# Patient Record
Sex: Male | Born: 1970 | Race: White | Hispanic: No | Marital: Married | Smoking: Never smoker
Health system: Southern US, Community
[De-identification: ages and names within clinical notes are randomized; demographics above are authoritative.]

## PROBLEM LIST (undated history)

## (undated) DIAGNOSIS — C4491 Basal cell carcinoma of skin, unspecified: Secondary | ICD-10-CM

## (undated) DIAGNOSIS — G4730 Sleep apnea, unspecified: Secondary | ICD-10-CM

## (undated) DIAGNOSIS — K219 Gastro-esophageal reflux disease without esophagitis: Secondary | ICD-10-CM

## (undated) DIAGNOSIS — E119 Type 2 diabetes mellitus without complications: Secondary | ICD-10-CM

## (undated) DIAGNOSIS — K760 Fatty (change of) liver, not elsewhere classified: Secondary | ICD-10-CM

## (undated) HISTORY — DX: Type 2 diabetes mellitus without complications: E11.9

## (undated) HISTORY — DX: Gastro-esophageal reflux disease without esophagitis: K21.9

## (undated) HISTORY — DX: Fatty (change of) liver, not elsewhere classified: K76.0

## (undated) HISTORY — DX: Sleep apnea, unspecified: G47.30

## (undated) HISTORY — DX: Basal cell carcinoma of skin, unspecified: C44.91

## (undated) HISTORY — PX: LUMBAR SPINE SURGERY: SHX701

## (undated) HISTORY — PX: SKIN CANCER EXCISION: SHX779

---

## 2013-03-06 DIAGNOSIS — Z85828 Personal history of other malignant neoplasm of skin: Secondary | ICD-10-CM | POA: Insufficient documentation

## 2018-01-09 ENCOUNTER — Ambulatory Visit: Payer: BLUE CROSS/BLUE SHIELD | Admitting: Podiatry

## 2018-01-09 ENCOUNTER — Encounter: Payer: Self-pay | Admitting: Podiatry

## 2018-01-09 ENCOUNTER — Other Ambulatory Visit: Payer: Self-pay | Admitting: Podiatry

## 2018-01-09 ENCOUNTER — Ambulatory Visit (INDEPENDENT_AMBULATORY_CARE_PROVIDER_SITE_OTHER): Payer: BLUE CROSS/BLUE SHIELD

## 2018-01-09 VITALS — BP 138/81 | HR 81

## 2018-01-09 DIAGNOSIS — M79671 Pain in right foot: Secondary | ICD-10-CM | POA: Diagnosis not present

## 2018-01-09 DIAGNOSIS — M722 Plantar fascial fibromatosis: Secondary | ICD-10-CM

## 2018-01-09 MED ORDER — TRIAMCINOLONE ACETONIDE 10 MG/ML IJ SUSP
10.0000 mg | Freq: Once | INTRAMUSCULAR | Status: AC
  Administered 2018-01-09: 10 mg

## 2018-01-09 MED ORDER — DICLOFENAC SODIUM 75 MG PO TBEC: 75.0000 mg | DELAYED_RELEASE_TABLET | Freq: Two times a day (BID) | ORAL | 2 refills | Status: DC

## 2018-01-09 NOTE — Patient Instructions (Signed)

## 2018-01-09 NOTE — Progress Notes (Signed)
Subjective:   Patient ID: Kristopher Hamilton, male   DOB: 47 y.o.   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  Patient does not smoke and likes to be active   Review of Systems  All other systems reviewed and are negative.       Objective:  Physical Exam  Constitutional: He appears well-developed and well-nourished.  Cardiovascular: Intact distal pulses.  Pulmonary/Chest: Effort normal.  Musculoskeletal: Normal range of motion.  Neurological: He is alert.  Skin: Skin is warm.  Nursing note and vitals reviewed.   Neurovascular status intact muscle strength is found to be adequate with patient found to have exquisite discomfort plantar aspect right heel at the insertional point of the tendon into the calcaneus with inflammation fluid buildup around the medial band.  Patient has mild depression of the arch and was found to have good digital perfusion     Assessment:  Acute plantar fasciitis right with inflammation fluid of the medial band     Plan:  H&P condition reviewed and today I injected the plantar fascia right 3 mg Kenalog 5 mg Xylocaine applied fascial brace placed on diclofenac 75 mg twice daily and instructed on long-term orthotic therapy.  Patient will be seen back to reevaluate again in 2 weeks and was given instructions on stretching exercises  X-rays indicate small spur with no indication of stress fracture or arthritis

## 2018-01-23 ENCOUNTER — Ambulatory Visit: Payer: BLUE CROSS/BLUE SHIELD | Admitting: Podiatry

## 2018-01-23 ENCOUNTER — Encounter: Payer: Self-pay | Admitting: Podiatry

## 2018-01-23 DIAGNOSIS — M722 Plantar fascial fibromatosis: Secondary | ICD-10-CM

## 2018-01-25 NOTE — Progress Notes (Signed)
Subjective:   Patient ID: Kristopher Hamilton, male   DOB: 47 y.o.   MRN: [id number]   HPI Patient presents stating foot feels a lot better with minimal discomfort   ROS      Objective:  Physical Exam  Neurovascular status intact with diminishment of discomfort plantar aspect right with pain still upon deep palpation but overall significantly improved     Assessment:  Plantar fasciitis improved with mild discomfort     Plan:  Reviewed condition recommended continued anti-inflammatory and patient will be seen back for [place] to recheck

## 2018-02-25 ENCOUNTER — Ambulatory Visit: Payer: BLUE CROSS/BLUE SHIELD | Admitting: Orthotics

## 2018-02-25 DIAGNOSIS — M722 Plantar fascial fibromatosis: Secondary | ICD-10-CM

## 2018-02-25 NOTE — Progress Notes (Signed)
Patient came in today to pick up custom made foot orthotics.  The goals were accomplished and the patient reported no dissatisfaction with said orthotics.  Patient was advised of breakin period and how to report any issues. 

## 2018-04-04 ENCOUNTER — Encounter: Payer: Self-pay | Admitting: Gastroenterology

## 2018-04-26 ENCOUNTER — Encounter: Payer: Self-pay | Admitting: Gastroenterology

## 2018-04-26 ENCOUNTER — Other Ambulatory Visit (INDEPENDENT_AMBULATORY_CARE_PROVIDER_SITE_OTHER): Payer: BLUE CROSS/BLUE SHIELD

## 2018-04-26 ENCOUNTER — Ambulatory Visit: Payer: BLUE CROSS/BLUE SHIELD | Admitting: Gastroenterology

## 2018-04-26 VITALS — BP 120/70 | HR 92 | Ht 72.0 in | Wt 177.0 lb

## 2018-04-26 DIAGNOSIS — R945 Abnormal results of liver function studies: Secondary | ICD-10-CM | POA: Diagnosis not present

## 2018-04-26 DIAGNOSIS — R7989 Other specified abnormal findings of blood chemistry: Secondary | ICD-10-CM

## 2018-04-26 LAB — HEPATIC FUNCTION PANEL
ALK PHOS: 112 U/L (ref 39–117)
ALT: 284 U/L — AB (ref 0–53)
AST: 113 U/L — AB (ref 0–37)
Albumin: 4.3 g/dL (ref 3.5–5.2)
BILIRUBIN DIRECT: 0.1 mg/dL (ref 0.0–0.3)
Total Bilirubin: 0.4 mg/dL (ref 0.2–1.2)
Total Protein: 7.1 g/dL (ref 6.0–8.3)

## 2018-04-26 LAB — PROTIME-INR
INR: 1 ratio (ref 0.8–1.0)
PROTHROMBIN TIME: 12.2 s (ref 9.6–13.1)

## 2018-04-26 LAB — IBC PANEL
Iron: 93 ug/dL (ref 42–165)
SATURATION RATIOS: 20.8 % (ref 20.0–50.0)
Transferrin: 320 mg/dL (ref 212.0–360.0)

## 2018-04-26 LAB — IGA: IGA: 296 mg/dL (ref 68–378)

## 2018-04-26 LAB — FERRITIN: FERRITIN: 481.2 ng/mL — AB (ref 22.0–322.0)

## 2018-04-26 NOTE — Progress Notes (Signed)
04/26/2018 Kristopher Hamilton [id number] [birth year]   HISTORY OF PRESENT ILLNESS:  This is a pleasant 47 year old male who is new to our office.  He is here today at the request of his PCP, Dellia Nims, NP, for evaluation of elevated liver enzymes.  Apparently the first that he was aware of this elevation was back in February when he was found to have an AST of 61 and ALT of 121.  They have continued to trend upwards over the past several months now in August with an AST of 251 and ALT of 545.  Alk phos and total bili have remained normal.  Viral hepatitis studies are negative.  He says that he had an ultrasound that showed fatty liver.  We do not have those records but we are trying to obtain those.  He denies any alcohol use at this time.  Says that he had a stint where he drank larger amounts in the past, but not for an extended period of time.  He denies any over-the-counter medication or supplement use.  He denies any family history of liver disease.  While he is here he does mention that he tends to have alternating bowel habits.  Says that sometimes he will only have a bowel movement once a week and other times he will have 3-4 bowel movements a day.  He says that his been like this for as long as he can remember.  Denies rectal bleeding.  Apparently he has seen Dr. Posey Pronto in [place], [place] in the past and had an endoscopy in approximately February 2017 for complaints of reflux.  We will try to obtain his results.  He is on pantoprazole 40 mg daily and admits that that seems to control his reflux pretty well.    Past Medical History:  Diagnosis Date  . Diabetes ([place])   . Skin cancer, basal cell   . Sleep apnea    Past Surgical History:  Procedure Laterality Date  . LUMBAR SPINE SURGERY    . SKIN CANCER EXCISION      reports that he has never smoked. He quit smokeless tobacco use about 2 years ago. His alcohol and drug histories are not on file. family history includes Diabetes in his  father; Hypertension in his mother; Prostate cancer in his father and maternal grandfather. No Known Allergies    Outpatient Encounter Medications as of 04/26/2018  Medication Sig  . aspirin EC 81 MG tablet Take 81 mg by mouth daily.  . diclofenac (VOLTAREN) 75 MG EC tablet Take 1 tablet (75 mg total) by mouth 2 (two) times daily.  Marland Kitchen docusate sodium (COLACE) 100 MG capsule Take 100 mg by mouth 2 (two) times daily.  . fexofenadine (ALLEGRA) 180 MG tablet Take 180 mg by mouth daily.  Marland Kitchen lisinopril (PRINIVIL,ZESTRIL) 5 MG tablet Take 5 mg by mouth daily.  . metFORMIN (GLUCOPHAGE) 500 MG tablet Take 500 mg by mouth 2 (two) times daily with a meal.  . pantoprazole (PROTONIX) 40 MG tablet Take 40 mg by mouth daily.   No facility-administered encounter medications on file as of 04/26/2018.      REVIEW OF SYSTEMS  : All other systems reviewed and negative except where noted in the History of Present Illness.   PHYSICAL EXAM: BP 120/70   Pulse 92   Ht 6' (1.829 m)   Wt 177 lb (80.3 kg)   BMI 24.01 kg/m  General: Well developed white male in no acute distress Head: Normocephalic  and atraumatic Eyes:  Sclerae anicteric, conjunctiva pink. Ears: Normal auditory acuity Lungs: Clear throughout to auscultation; no increased WOB. Heart: Regular rate and rhythm; no M/R/G. Abdomen: Soft, non-distended.  BS present.  Non-tender. Musculoskeletal: Symmetrical with no gross deformities  Skin: No lesions on visible extremities Extremities: No edema  Neurological: Alert oriented x 4, grossly non-focal Psychological:  Alert and cooperative. Normal mood and affect  ASSESSMENT AND PLAN: *Elevated LFT's/transaminases:  Elevated over the past several months and trending upwards with ALT in the 500's and AST in the 200's.  Ultrasound shows fatty liver.  No ETOH use, had some in the past but not extensive history.  No OTC medications.  No FH liver disease.  Viral hepatitis studies negative.  Will check  remaining serologic evaluation including repeating LFT's to rule out other causes of chronic liver disease.  If continue trend up and all other evaluation negative then may need biopsy. *Alternating bowel habits:  Varies from one BM per week to a few BM's per day.  Has been like this forever.  Recommended to start with a daily powder fiber supplement such as Metamucil or Citrucel, which can also help lower cholesterol levels.  *Will obtain previous GI records from Dr. Posey Pronto as he apparently had an EGD performed there 2 years ago.   CC:  No ref. provider found

## 2018-04-26 NOTE — Patient Instructions (Signed)
Your provider has requested that you go to the basement level for lab work before leaving today. Press "B" on the elevator. The lab is located at the first door on the left as you exit the elevator.  Use a daily powder fiber such as Metamucil or Citrucel, one heaping tablespoon in 8 oz of water or juice.

## 2018-04-29 LAB — ANA: ANA: NEGATIVE

## 2018-04-29 LAB — ANTI-SMOOTH MUSCLE ANTIBODY, IGG: Actin (Smooth Muscle) Antibody (IGG): <20 U (ref <20)

## 2018-04-29 LAB — ALPHA-1-ANTITRYPSIN: A-1 Antitrypsin, Ser: 134 mg/dL (ref 83–199)

## 2018-04-29 LAB — MITOCHONDRIAL ANTIBODIES: Mitochondrial M2 Ab, IgG: <=20 U

## 2018-04-29 LAB — TISSUE TRANSGLUTAMINASE, IGA: (tTG) Ab, IgA: 1 U/mL

## 2018-04-29 LAB — HEPATITIS B SURFACE ANTIBODY,QUALITATIVE: Hep B S Ab: NONREACTIVE

## 2018-04-29 LAB — CERULOPLASMIN: Ceruloplasmin: 31 mg/dL (ref 18–36)

## 2018-05-01 ENCOUNTER — Telehealth: Payer: Self-pay | Admitting: Gastroenterology

## 2018-05-01 DIAGNOSIS — R945 Abnormal results of liver function studies: Principal | ICD-10-CM

## 2018-05-01 DIAGNOSIS — R7989 Other specified abnormal findings of blood chemistry: Secondary | ICD-10-CM

## 2018-05-01 NOTE — Telephone Encounter (Signed)
Kristopher Hamilton please review labs the pt is calling thanks

## 2018-05-01 NOTE — Telephone Encounter (Signed)
I am waiting for Carlean Purl to give me input on this as well.  Will get back with him probably tomorrow.

## 2018-05-02 NOTE — Telephone Encounter (Signed)
Hamilton, Kristopher Emperor, PA-C  Timothy Lasso, RN        So Dr. Carlean Purl is off for the next few days. Please let the patient know that all of his labs were normal/negative for any other causes of chronic liver disease. So now we are back to the fatty liver thing for now. His LFT's did come down from previous (now AST 113 and ALT 284). I am going to have them rechecked again in one month and see what they are doing before we decide to do any other evaluation such as biopsy, etc. Please enter for hepatic function panel in one month.   Thank you,   Jess

## 2018-05-02 NOTE — Telephone Encounter (Signed)
The pt has been advised and will come in for repeat liver test in 1 month

## 2018-05-03 NOTE — Progress Notes (Signed)
Agree with recheck in 1 month - you are on target Would do a total CK ? Muscle source though unlikely If LFT's still up and CK is not then liver bx

## 2018-05-06 ENCOUNTER — Other Ambulatory Visit: Payer: Self-pay

## 2018-05-06 ENCOUNTER — Telehealth: Payer: Self-pay | Admitting: Gastroenterology

## 2018-05-06 DIAGNOSIS — R7989 Other specified abnormal findings of blood chemistry: Secondary | ICD-10-CM

## 2018-05-06 DIAGNOSIS — R945 Abnormal results of liver function studies: Principal | ICD-10-CM

## 2018-05-06 NOTE — Telephone Encounter (Signed)
See result note.  

## 2018-05-15 ENCOUNTER — Encounter: Payer: Self-pay | Admitting: Podiatry

## 2018-05-15 ENCOUNTER — Ambulatory Visit: Payer: BLUE CROSS/BLUE SHIELD | Admitting: Podiatry

## 2018-05-15 DIAGNOSIS — M722 Plantar fascial fibromatosis: Secondary | ICD-10-CM | POA: Diagnosis not present

## 2018-05-15 MED ORDER — TRIAMCINOLONE ACETONIDE 10 MG/ML IJ SUSP
10.0000 mg | Freq: Once | INTRAMUSCULAR | Status: AC
  Administered 2018-05-15: 10 mg

## 2018-05-18 NOTE — Progress Notes (Signed)
Subjective:   Patient ID: Kristopher Hamilton, male   DOB: 47 y.o.   MRN: [id number]   HPI Patient presents stating his left heel has really started to bother me and I need new orthotics and my other went to recovery.  Patient works in a weightbearing type job [employer] and is on his feet at all times   ROS      Objective:  Physical Exam  Neurovascular status intact with exquisite discomfort left plantar fascial insertional point tendon calcaneus with orthotics which have worn down secondary to pressure and also pain mostly when he gets up in the morning and after periods of sitting     Assessment:  Acute plantar fasciitis left with inflammation fluid around the medial band with depressed arch and tight plantar fashion     Plan:  H&P and all conditions reviewed.  Today I did sterile prep and then reinjected the plantar fascia 3 mg Kenalog 5 mg Xylocaine and applied night splint with instructions on usage.  I then placed the patient and scanned for a [place] type orthotic with deep heel seat and instructed him to bring his old pair in for recovery when the new ones are ready

## 2018-06-04 ENCOUNTER — Ambulatory Visit: Payer: Self-pay | Admitting: Orthotics

## 2018-06-04 DIAGNOSIS — M79671 Pain in right foot: Secondary | ICD-10-CM

## 2018-06-04 DIAGNOSIS — M79676 Pain in unspecified toe(s): Secondary | ICD-10-CM

## 2018-06-04 DIAGNOSIS — M722 Plantar fascial fibromatosis: Secondary | ICD-10-CM

## 2018-06-04 NOTE — Progress Notes (Signed)
Patient came in today to pick up custom made foot orthotics.  The goals were accomplished and the patient reported no dissatisfaction with said orthotics.  Patient was advised of breakin period and how to report any issues. 

## 2018-06-07 ENCOUNTER — Ambulatory Visit: Payer: BLUE CROSS/BLUE SHIELD | Admitting: Podiatry

## 2018-06-07 ENCOUNTER — Ambulatory Visit (INDEPENDENT_AMBULATORY_CARE_PROVIDER_SITE_OTHER): Payer: BLUE CROSS/BLUE SHIELD

## 2018-06-07 ENCOUNTER — Other Ambulatory Visit: Payer: Self-pay | Admitting: Podiatry

## 2018-06-07 ENCOUNTER — Encounter: Payer: Self-pay | Admitting: Podiatry

## 2018-06-07 DIAGNOSIS — M722 Plantar fascial fibromatosis: Secondary | ICD-10-CM

## 2018-06-07 DIAGNOSIS — M79671 Pain in right foot: Secondary | ICD-10-CM

## 2018-06-07 DIAGNOSIS — M779 Enthesopathy, unspecified: Secondary | ICD-10-CM | POA: Diagnosis not present

## 2018-06-07 MED ORDER — TRIAMCINOLONE ACETONIDE 10 MG/ML IJ SUSP
10.0000 mg | Freq: Once | INTRAMUSCULAR | Status: AC
  Administered 2018-06-07: 10 mg

## 2018-06-10 ENCOUNTER — Telehealth: Payer: Self-pay

## 2018-06-10 DIAGNOSIS — R945 Abnormal results of liver function studies: Principal | ICD-10-CM

## 2018-06-10 DIAGNOSIS — R7989 Other specified abnormal findings of blood chemistry: Secondary | ICD-10-CM

## 2018-06-10 NOTE — Telephone Encounter (Signed)
-----   Message from Loralie Champagne, PA-C sent at 06/10/2018  4:04 PM EDT ----- Please let him know that his LFT's are trending up again.  AST 203 and ALT 488.  (Unfortunately he did not have his labs done here so the CK was not performed, but that is ok for now).  He needs to have a liver biopsy to evaluate further as to what is going on as this is a much further elevation than we should expect with fatty liver, but all of his other labs were normal.  Thank you,  Jess

## 2018-06-10 NOTE — Progress Notes (Signed)
Subjective:   Patient ID: Kristopher Hamilton, male   DOB: 47 y.o.   MRN: [id number]   HPI Patient presents stating he is improved but he still getting some discomfort more on the outside of the foot secondary to probably compensation from the heel pain   ROS      Objective:  Physical Exam  Neurovascular status intact with diminishment of discomfort plantar heel but having quite a bit of discomfort lateral side of the foot     Assessment:  Tendinitis secondary to probable change in gait with improvement of the plantar fascial symptoms     Plan:  H&P recommended continued supportive shoe gear usage and careful lateral foot injection administered peroneal tendon insertion base of fifth metatarsal 3 mg Kenalog 5 mg Xylocaine advised on ice therapy.  Reappoint as symptoms indicate

## 2018-06-11 NOTE — Telephone Encounter (Signed)
The pt was advised and agrees to liver biopsy.  Referral made and the pt will call if he has not heard from that office in a few days.

## 2018-06-11 NOTE — Telephone Encounter (Signed)
Left message on machine to call back  

## 2018-06-12 ENCOUNTER — Telehealth: Payer: Self-pay | Admitting: Gastroenterology

## 2018-06-12 NOTE — Telephone Encounter (Signed)
Kristopher Hamilton the pt and his wife have questions about the liver biopsy.  They were contacted to schedule the biopsy but want to speak with you before they actually agree to an appt.  Do you want me to get him in for an appt or do you want to call him?

## 2018-06-12 NOTE — Telephone Encounter (Signed)
Left message on machine to call back  

## 2018-06-12 NOTE — Telephone Encounter (Signed)
#  [phone]  Pt's wife is returning your call

## 2018-06-13 NOTE — Telephone Encounter (Signed)
I did try to call both his and his wife's cell phones.  Was sent to voicemail on both of them.  I can try to give them a call when I return back next week.  I am off tomorrow and Monday.  I was going to just try to speak with them on the phone since they live in [place] rather than making them come in for a visit.  Please try to contact them and give them this update.   Thank you,  Jess

## 2018-06-14 NOTE — Telephone Encounter (Signed)
Left message on machine to call back will wait for the pt or his wife to return call.

## 2018-06-21 NOTE — Telephone Encounter (Signed)
Lab orders to be faxed, left message for pt to return call to get the fax number to the facility he wants the labs drawn

## 2018-06-21 NOTE — Telephone Encounter (Signed)
Discussed with patient's wife.  She actually said that he was back on voltaren again when he had his last labs performed.  I wonder if that is causing some of his elevation.  She is going to cancel his biopsy for now and we are going to have his LFT's rechecked again later this month (I told her maybe around 11/22 which will be 5 weeks off of the voltaren again since he has already been back off of it for 2 weeks now).  Can you please send the orders to them through [facility] or however you did last time so that they can have them drawn in [place].  Would need LFT's and let's do the CK since it did not look like they did that last time.  Thank you,   Jess

## 2018-06-24 NOTE — Telephone Encounter (Signed)
I have tried to contact the pt at all available numbers and the phone is not connecting.  Will try at a later time.

## 2018-06-25 NOTE — Telephone Encounter (Signed)
Unable to reach pt lab order mailed to the pt home

## 2018-07-05 ENCOUNTER — Ambulatory Visit (HOSPITAL_COMMUNITY): Payer: BLUE CROSS/BLUE SHIELD

## 2018-07-29 ENCOUNTER — Encounter: Payer: Self-pay | Admitting: Podiatry

## 2018-07-29 ENCOUNTER — Ambulatory Visit (INDEPENDENT_AMBULATORY_CARE_PROVIDER_SITE_OTHER): Payer: BLUE CROSS/BLUE SHIELD

## 2018-07-29 ENCOUNTER — Other Ambulatory Visit: Payer: Self-pay | Admitting: Podiatry

## 2018-07-29 ENCOUNTER — Ambulatory Visit: Payer: BLUE CROSS/BLUE SHIELD | Admitting: Podiatry

## 2018-07-29 DIAGNOSIS — M84374A Stress fracture, right foot, initial encounter for fracture: Secondary | ICD-10-CM

## 2018-07-29 DIAGNOSIS — M79671 Pain in right foot: Secondary | ICD-10-CM

## 2018-07-29 NOTE — Telephone Encounter (Signed)
Kristopher Hamilton have you seen the results?

## 2018-07-29 NOTE — Patient Instructions (Signed)

## 2018-07-31 NOTE — Progress Notes (Signed)
Subjective:   Patient ID: Kristopher Hamilton, male   DOB: 47 y.o.   MRN: [id number]   HPI Patient presents stating that he is developed a lot of pain on top of his right foot over the last few days and does not remember specific injury   ROS      Objective:  Physical Exam  Neurovascular status found to be intact heel left is improving with exquisite discomfort third metatarsal distal shaft right with swelling surrounding this area     Assessment:  Probability for stress fracture right dorsal foot with inflammation pain of the third metatarsal distal shaft     Plan:  H&P condition reviewed and at this point recommend aggressive ice and placed in the air fracture walker to completely immobilize the bone to reduce stress against this area.  Patient will be seen back again in approximately 3 to 4 weeks or earlier if needed  X-ray indicates suspicious sign distal third metatarsal right

## 2018-08-12 ENCOUNTER — Telehealth: Payer: Self-pay

## 2018-08-12 NOTE — Telephone Encounter (Signed)
-----   Message from Loralie Champagne, PA-C sent at 08/12/2018  1:37 PM EST ----- Regarding: FW: send a letter Can you please try to touch base with him again?  See last my chart message from [place].  Thank you, Jess  ----- Message ----- From: [name], MD Sent: 08/10/2018   7:52 AM EST To: Loralie Champagne, PA-C Subject: send a letter                                  I know you and Paty have tried to contact him but he has not followed up  See if he can be raised by phone again or send a  letter (did he read the My Chart message?) to at least get LFT recheck  Thanks

## 2018-08-12 NOTE — Telephone Encounter (Signed)
I have tired to reach the pt and also sent a letter with no response.  I tried to call again today with no response.

## 2018-08-22 NOTE — Telephone Encounter (Signed)
I cannot see the letter in letters section  Just double-checking  So if I am correct would send him a letter that he needs to see [place] again and get labs - can see jess  Thanks

## 2018-08-26 ENCOUNTER — Ambulatory Visit (INDEPENDENT_AMBULATORY_CARE_PROVIDER_SITE_OTHER): Payer: BLUE CROSS/BLUE SHIELD

## 2018-08-26 ENCOUNTER — Encounter: Payer: Self-pay | Admitting: Podiatry

## 2018-08-26 ENCOUNTER — Ambulatory Visit: Payer: BLUE CROSS/BLUE SHIELD | Admitting: Podiatry

## 2018-08-26 DIAGNOSIS — M84374D Stress fracture, right foot, subsequent encounter for fracture with routine healing: Secondary | ICD-10-CM | POA: Diagnosis not present

## 2018-08-28 NOTE — Progress Notes (Signed)
Subjective:   Patient ID: Kristopher Hamilton, male   DOB: 48 y.o.   MRN: [id number]   HPI Patient presents stating I am still having pain right but it is gradually improving with swelling and I am still wearing my boot most of the time currently   ROS      Objective:  Physical Exam  Neurovascular status intact with continued discomfort in the midfoot right with pain when I pressed into the area but it is localized and is not extensive currently     Assessment:  Probability for stress fracture or reaction right that is continuing to heal but is still present and still in the healing mode     Plan:  H&P x-ray reviewed and at this point I do think that we can allow him to wear a rigid bottom shoe and gradual return to work with continued boot usage as needed.  Advised on aggressive ice therapy anti-inflammatories and reappoint if symptoms are persisting past another 4 weeks  X-rays indicate that there is no indications of acute progressive stress fracture and it appears to be low-grade chronic

## 2018-09-06 ENCOUNTER — Other Ambulatory Visit: Payer: Self-pay

## 2018-09-06 DIAGNOSIS — R945 Abnormal results of liver function studies: Principal | ICD-10-CM

## 2018-09-06 DIAGNOSIS — R7989 Other specified abnormal findings of blood chemistry: Secondary | ICD-10-CM

## 2018-09-09 ENCOUNTER — Encounter (HOSPITAL_COMMUNITY): Payer: Self-pay

## 2018-09-09 ENCOUNTER — Ambulatory Visit (HOSPITAL_COMMUNITY): Admit: 2018-09-09 | Payer: BLUE CROSS/BLUE SHIELD | Admitting: Gastroenterology

## 2018-09-09 ENCOUNTER — Ambulatory Visit: Payer: BLUE CROSS/BLUE SHIELD | Admitting: Podiatry

## 2018-09-09 SURGERY — ENDOSCOPIC RETROGRADE CHOLANGIOPANCREATOGRAPHY (ERCP) WITH PROPOFOL
Anesthesia: General

## 2018-09-20 ENCOUNTER — Telehealth: Payer: Self-pay

## 2018-09-20 NOTE — Telephone Encounter (Signed)
-----   Message from Loralie Champagne, PA-C sent at 09/19/2018  4:49 PM EST ----- Please contact the patient and let him know that his LFTs are up again.  AST was 209 compared to 116 in November and ALT was 386 compared to 298 in November.  Due to the persistent elevation I really think that we should try to proceed with liver biopsy to confirm exactly what is causing this.  This is a concerning elevation over significant period of time and if there is anything else that we can do to help with this then we would like to be sure that we are not missing any other cause.  Thank you,  Jess

## 2018-09-20 NOTE — Telephone Encounter (Signed)
Zehr, Laban Emperor, PA-C  Timothy Lasso, RN        Please make him an appointment to come discuss with Dr. Carlean Purl if he would like to do that before proceeding.

## 2018-09-20 NOTE — Telephone Encounter (Signed)
appt scheduled for 2/5 at 1130 am to see Dr Carlean Purl.  The pt has been advised

## 2018-09-23 ENCOUNTER — Telehealth: Payer: Self-pay | Admitting: Internal Medicine

## 2018-09-23 NOTE — Telephone Encounter (Signed)
Pt's wife called to inform that her husband cannot come to appt on 2/5, he is available this week either on the 4 or 7, next week the 13 and 14, and the following week on the 105 and 21. Pls call her.

## 2018-09-23 NOTE — Telephone Encounter (Signed)
The pt was rescheduled to 3/2 at 945 am.  The pt was advised and he will call with any further questions or concerns

## 2018-09-25 ENCOUNTER — Ambulatory Visit: Payer: BLUE CROSS/BLUE SHIELD | Admitting: Internal Medicine

## 2018-10-21 ENCOUNTER — Encounter: Payer: Self-pay | Admitting: Internal Medicine

## 2018-10-21 ENCOUNTER — Ambulatory Visit: Payer: BLUE CROSS/BLUE SHIELD | Admitting: Internal Medicine

## 2018-10-21 DIAGNOSIS — R945 Abnormal results of liver function studies: Secondary | ICD-10-CM | POA: Diagnosis not present

## 2018-10-21 DIAGNOSIS — R7989 Other specified abnormal findings of blood chemistry: Secondary | ICD-10-CM

## 2018-10-21 NOTE — Progress Notes (Signed)
Kristopher Hamilton 48 y.o. 16-Jul-1971 [id number]  Assessment & Plan:  Elevated LFTs Given his situation this probably is fatty liver disease.  However the elevation of transaminases is higher than one would expect, if it is fatty liver disease then I would think he probably has significant hepatitis ongoing.   I think it is reasonable to pursue a liver biopsy to understand the status of the liver and to see if there is an etiology not yet found that would be treated differently.  They understand and agree to schedule.  I will recheck LFTs and to try to get a total CK, we will use his lab in [place] and have the results faxed to me.  He wishes to have a liver biopsy scheduled on 1 of the Fridays he is off when he has every other Friday Saturday Sunday off. I did review the risks benefits and indications of ultrasound-guided liver biopsy.    I appreciate the opportunity to care for this patient. CC: Tempie Hoist, FNP   Subjective:   Chief Complaint: Abnormal transaminases  HPI The patient is a 48 year old white man with history of abnormal transaminases of unclear etiology.  He is here for follow-up.  He was first seen in our office by Alonza Bogus, PA-C, in September 2019.  Abnormal liver tests go back to February 2019 with AST 61 ALT 121 with a trend upward and in August 2019 AST 251 and ALT 545.  His alkaline phosphatase and bilirubin remain normal.  His hepatitis A, B, and C studies have been negative.  An ultrasound in [place] demonstrated changes that looked like fatty liver without focal lesions and no biliary dilations.  In September he had a recheck of LFTs with an AST 113 and 284.  Ferritin was 41.  Iron saturation low.  Celiac screening ANA, alpha-1 antitrypsin mitochondrial antibodies anti-smooth muscle antibody ceruloplasmin all normal/negative.  Those tests ended up being quite expensive so he had further testing done up in [place] and in October ALT 48 AST 203.  Then in November  AST 116 ALT 298.  They were 209 and 386 in late January 2020.  He has been having CK-MBs checked we wanted total CK though he does not have any myalgias or muscle aches.  He really feels well.  He has a physical job at [employer], he is never used any supplements of any kind, his medications are stable.  He did come off of Voltaren at one point to see if that was perhaps the cause.  Not much alcohol if at all.  No family history of liver disease.  His INR has been normal platelets have been normal.  He was on a statin at one point in 2019 but that was stopped when the abnormal LFTs were found. Allergies  Allergen Reactions  . Oxycodone Itching   Current Meds  Medication Sig  . aspirin EC 81 MG tablet Take 81 mg by mouth daily.  Marland Kitchen docusate sodium (COLACE) 100 MG capsule Take 100 mg by mouth 2 (two) times daily.  . fexofenadine (ALLEGRA) 180 MG tablet Take 180 mg by mouth daily.  Marland Kitchen lisinopril (PRINIVIL,ZESTRIL) 5 MG tablet Take 5 mg by mouth daily.  . metFORMIN (GLUCOPHAGE) 500 MG tablet Take 500 mg by mouth 2 (two) times daily with a meal.  . pantoprazole (PROTONIX) 40 MG tablet Take 40 mg by mouth daily.   Past Medical History:  Diagnosis Date  . Diabetes ([place])   . Skin cancer, basal cell   .  Sleep apnea    Past Surgical History:  Procedure Laterality Date  . LUMBAR SPINE SURGERY    . SKIN CANCER EXCISION     Social History   Social History Narrative   He is married and works at [employer] in [place]   3 children   Occasional alcohol   Never smoker, former snuff user, no drug use reported and no supplements   family history includes Diabetes in his father; Hypertension in his mother; Prostate cancer in his father and maternal grandfather.   Review of Systems As per HPI  Objective:   Physical Exam BP 116/76 (BP Location: Left Arm, Patient Position: Sitting, Cuff Size: Normal)   Pulse 88   Ht 6' (1.829 m) Comment: height measured without shoes  Wt 226 lb (102.5 kg)    BMI 30.65 kg/m  Well-developed well-nourished no acute distress The eyes are anicteric The abdomen is soft and nontender without organomegaly or mass Areas of skin expected do not show any stigmata of chronic liver disease  15 minutes time spent with patient > half in counseling coordination of care

## 2018-10-21 NOTE — Assessment & Plan Note (Addendum)
Given his situation this probably is fatty liver disease.  However the elevation of transaminases is higher than one would expect, if it is fatty liver disease then I would think he probably has significant hepatitis ongoing.   I think it is reasonable to pursue a liver biopsy to understand the status of the liver and to see if there is an etiology not yet found that would be treated differently.  They understand and agree to schedule.  I will recheck LFTs and to try to get a total CK, we will use his lab in [place] and have the results faxed to me.  He wishes to have a liver biopsy scheduled on 1 of the Fridays he is off when he has every other Friday Saturday Sunday off. I did review the risks benefits and indications of ultrasound-guided liver biopsy.

## 2018-10-21 NOTE — Patient Instructions (Signed)
Please take your lab orders with you today and have them them fax [place] the results.   You will be contacted about setting up your liver biopsy.    I appreciate the opportunity to care for you. Silvano Rusk, MD, [facility]

## 2018-10-22 ENCOUNTER — Encounter: Payer: Self-pay | Admitting: Internal Medicine

## 2018-10-28 ENCOUNTER — Telehealth: Payer: Self-pay

## 2018-10-28 NOTE — Telephone Encounter (Signed)
-----   Message from [name], MD sent at 10/28/2018 12:20 PM EDT ----- Regarding: Labs Let him know liver tests still elevated and that the testing to see if coming from muscles tells me it is NOT from the muscles so we are on right track with liver biopsy that he will be having

## 2018-10-28 NOTE — Telephone Encounter (Signed)
As Blessed was unavailable I spoke with his wife Kristopher Hamilton. I informed her as Dr Carlean Purl instructed and he is set up to do the liver biopsy on March 23 rd at [facility] at 1:00pm. They are aware of the date and time.

## 2018-11-11 ENCOUNTER — Ambulatory Visit (HOSPITAL_COMMUNITY): Admission: RE | Admit: 2018-11-11 | Payer: BLUE CROSS/BLUE SHIELD | Source: Ambulatory Visit

## 2018-11-21 ENCOUNTER — Other Ambulatory Visit: Payer: Self-pay | Admitting: Radiology

## 2018-11-21 ENCOUNTER — Other Ambulatory Visit: Payer: Self-pay | Admitting: Student

## 2018-11-22 ENCOUNTER — Encounter (HOSPITAL_COMMUNITY): Payer: Self-pay

## 2018-11-22 ENCOUNTER — Ambulatory Visit (HOSPITAL_COMMUNITY): Admission: RE | Admit: 2018-11-22 | Payer: BLUE CROSS/BLUE SHIELD | Source: Ambulatory Visit

## 2019-01-21 ENCOUNTER — Telehealth: Payer: Self-pay

## 2019-01-21 DIAGNOSIS — R7989 Other specified abnormal findings of blood chemistry: Secondary | ICD-10-CM

## 2019-01-21 NOTE — Telephone Encounter (Signed)
-----   Message from [name], MD sent at 01/21/2019  3:35 PM EDT ----- Regarding: reschedule liver bx His IR liver bx was cancelled due to Covid  Please see if this can be rescheduled now - not sure if they are contacting people - do not see anything set up

## 2019-01-21 NOTE — Telephone Encounter (Signed)
Left message for patient to call back  

## 2019-01-22 NOTE — Telephone Encounter (Signed)
Left message for patient to call back  

## 2019-01-27 NOTE — Telephone Encounter (Signed)
No answer and no return call from the patient.  Letter mailed to the patient asking him to call back here to discuss rescheduling.

## 2019-01-28 NOTE — Telephone Encounter (Signed)
Patient's wife returned call.  She would like to set up in August.  I placed the order. She is aware they will contact her directly from radiology to reschedule.

## 2019-01-28 NOTE — Addendum Note (Signed)
Addended by: Marlon Pel on: 01/28/2019 02:46 PM   Modules accepted: Orders

## 2019-01-30 ENCOUNTER — Telehealth (HOSPITAL_COMMUNITY): Payer: Self-pay

## 2019-02-03 ENCOUNTER — Telehealth (HOSPITAL_COMMUNITY): Payer: Self-pay

## 2019-02-03 ENCOUNTER — Telehealth: Payer: Self-pay | Admitting: Internal Medicine

## 2019-02-03 NOTE — Telephone Encounter (Signed)
Carrie from [facility] called to informed the doctor that the patient has been contacted 3 times for the ultra sound biopsy and yet to call back.

## 2019-02-03 NOTE — Telephone Encounter (Signed)
Patient wanted to schedule for August.  They will call back when they are ready to schedule.

## 2019-02-05 ENCOUNTER — Telehealth (HOSPITAL_COMMUNITY): Payer: Self-pay

## 2019-02-11 ENCOUNTER — Telehealth (HOSPITAL_COMMUNITY): Payer: Self-pay

## 2019-06-05 ENCOUNTER — Other Ambulatory Visit: Payer: Self-pay | Admitting: Physician Assistant

## 2019-06-06 ENCOUNTER — Ambulatory Visit (HOSPITAL_COMMUNITY)
Admission: RE | Admit: 2019-06-06 | Discharge: 2019-06-06 | Disposition: A | Discharge status: Home / Self Care | Payer: BC Managed Care – PPO | Source: Ambulatory Visit | Attending: Internal Medicine | Admitting: Internal Medicine

## 2019-06-06 ENCOUNTER — Other Ambulatory Visit: Payer: Self-pay

## 2019-06-06 ENCOUNTER — Encounter (HOSPITAL_COMMUNITY): Payer: Self-pay

## 2019-06-06 DIAGNOSIS — R7989 Other specified abnormal findings of blood chemistry: Secondary | ICD-10-CM | POA: Diagnosis present

## 2019-06-06 DIAGNOSIS — E119 Type 2 diabetes mellitus without complications: Secondary | ICD-10-CM | POA: Insufficient documentation

## 2019-06-06 DIAGNOSIS — Z7982 Long term (current) use of aspirin: Secondary | ICD-10-CM | POA: Diagnosis not present

## 2019-06-06 DIAGNOSIS — G4730 Sleep apnea, unspecified: Secondary | ICD-10-CM | POA: Diagnosis not present

## 2019-06-06 DIAGNOSIS — K219 Gastro-esophageal reflux disease without esophagitis: Secondary | ICD-10-CM | POA: Insufficient documentation

## 2019-06-06 DIAGNOSIS — K7581 Nonalcoholic steatohepatitis (NASH): Secondary | ICD-10-CM | POA: Insufficient documentation

## 2019-06-06 DIAGNOSIS — Z7984 Long term (current) use of oral hypoglycemic drugs: Secondary | ICD-10-CM | POA: Diagnosis not present

## 2019-06-06 DIAGNOSIS — Z85828 Personal history of other malignant neoplasm of skin: Secondary | ICD-10-CM | POA: Insufficient documentation

## 2019-06-06 DIAGNOSIS — Z79899 Other long term (current) drug therapy: Secondary | ICD-10-CM | POA: Diagnosis not present

## 2019-06-06 LAB — CBC
HCT: 47.1 % (ref 39.0–52.0)
Hemoglobin: 16 g/dL (ref 13.0–17.0)
MCH: 31 pg (ref 26.0–34.0)
MCHC: 34 g/dL (ref 30.0–36.0)
MCV: 91.3 fL (ref 80.0–100.0)
Platelets: 252 10*3/uL (ref 150–400)
RBC: 5.16 MIL/uL (ref 4.22–5.81)
RDW: 12.9 % (ref 11.5–15.5)
WBC: 6.6 10*3/uL (ref 4.0–10.5)
nRBC: 0 % (ref 0.0–0.2)

## 2019-06-06 LAB — GLUCOSE, CAPILLARY: Glucose-Capillary: 147 mg/dL — ABNORMAL HIGH (ref 70–99)

## 2019-06-06 LAB — PROTIME-INR
INR: 1 (ref 0.8–1.2)
Prothrombin Time: 13.1 seconds (ref 11.4–15.2)

## 2019-06-06 MED ORDER — FENTANYL CITRATE (PF) 100 MCG/2ML IJ SOLN
INTRAMUSCULAR | Status: AC
  Filled 2019-06-06: qty 2

## 2019-06-06 MED ORDER — LIDOCAINE HCL (PF) 1 % IJ SOLN
INTRAMUSCULAR | Status: AC
  Filled 2019-06-06: qty 30

## 2019-06-06 MED ORDER — SODIUM CHLORIDE 0.9 % IV SOLN: INTRAVENOUS | Status: DC

## 2019-06-06 MED ORDER — GELATIN ABSORBABLE 12-7 MM EX MISC
CUTANEOUS | Status: AC
  Filled 2019-06-06: qty 1

## 2019-06-06 MED ORDER — MIDAZOLAM HCL 2 MG/2ML IJ SOLN
INTRAMUSCULAR | Status: AC | PRN
  Administered 2019-06-06: 1 mg via INTRAVENOUS

## 2019-06-06 MED ORDER — MIDAZOLAM HCL 2 MG/2ML IJ SOLN
INTRAMUSCULAR | Status: AC
  Filled 2019-06-06: qty 2

## 2019-06-06 MED ORDER — FENTANYL CITRATE (PF) 100 MCG/2ML IJ SOLN
INTRAMUSCULAR | Status: AC | PRN
  Administered 2019-06-06: 50 ug via INTRAVENOUS

## 2019-06-06 NOTE — H&P (Signed)
Chief Complaint: Patient was seen in consultation today for a random liver biopsy.  Referring Physician(s): [name]  Supervising Physician: Corrie Mckusick  Patient Status: [facility] - Out-pt  History of Present Illness: Kristopher Hamilton is a 48 y.o. male with a past medical history significant for GERD, skin caner and DM who presents today for a random liver biopsy due to elevated LFTs followed by Dr. Carlean Purl. Per chart Kristopher Hamilton has been followed for elevated LFTs since February of 2019 that have continued to trend upward despite minimal alcohol use as well as cessation of statin/Voltaren. He underwent an [place] at an outside facility which was reported to show changes consistent with fatty liver disease, however this is not . A request has been made to IR for a random liver biopsy for further evaluation.  Kristopher Hamilton the past few days which he attributes to nerves regarding the biopsy, he is tolerating PO intake well and is not experiencing any nausea, vomiting or abdominal pain. He denies any other complaints today and wishes to proceed as planned.   Past Medical History:  Diagnosis Date  . Diabetes ([place])   . GERD (gastroesophageal reflux disease)   . Skin cancer, basal cell   . Sleep apnea     Past Surgical History:  Procedure Laterality Date  . LUMBAR SPINE SURGERY    . SKIN CANCER EXCISION      Allergies: Patient has no known allergies.  Medications: Prior to Admission medications   Medication Sig Start Date End Date Taking? Authorizing Provider  aspirin EC 81 MG tablet Take 81 mg by mouth daily.   Yes [provider]  docusate sodium (COLACE) 100 MG capsule Take 100 mg by mouth at bedtime.    Yes [provider]  fexofenadine (ALLEGRA) 180 MG tablet Take 180 mg by mouth daily.   Yes [provider]  lisinopril (PRINIVIL,ZESTRIL) 5 MG tablet Take 5 mg by mouth daily.   Yes [provider]  Magnesium Oxide 250  MG TABS Take 400 mg by mouth at bedtime.    Yes [provider]  metFORMIN (GLUCOPHAGE) 500 MG tablet Take 1,000 mg by mouth 2 (two) times daily with a meal.    Yes [provider]  pantoprazole (PROTONIX) 40 MG tablet Take 40 mg by mouth at bedtime.    Yes [provider]     Family History  Problem Relation Age of Onset  . Hypertension Mother   . Prostate cancer Father   . Diabetes Father   . Prostate cancer Maternal Grandfather     Social History   Socioeconomic History  . Marital status: Married    Spouse name: Not on file  . Number of children: 3  . Years of education: Not on file  . Highest education level: Not on file  Occupational History  . Occupation: Public librarian  Social Needs  . Financial resource strain: Not on file  . Food insecurity    Worry: Not on file    Inability: Not on file  . Transportation needs    Medical: Not on file    Non-medical: Not on file  Tobacco Use  . Smoking status: Never Smoker  . Smokeless tobacco: Former Network engineer and Sexual Activity  . Alcohol use: Not Currently    Comment: occasionally  . Drug use: Never  . Sexual activity: Not on file  Lifestyle  . Physical activity    Days per week: Not on  file    Minutes per session: Not on file  . Stress: Not on file  Relationships  . Social Herbalist on phone: Not on file    Gets together: Not on file    Attends religious service: Not on file    Active member of club or organization: Not on file    Attends meetings of clubs or organizations: Not on file    Relationship status: Not on file  Other Topics Concern  . Not on file  Social History Narrative   He is married and works at [employer] in [place]   3 children   Occasional alcohol   Never smoker, former snuff user, no drug use reported and no supplements     Review of Systems: A 12 point ROS discussed and pertinent positives are indicated in the HPI above.  All other  systems are negative.  Review of Systems  Constitutional: Negative for chills and fever.  Respiratory: Negative for cough and shortness of breath.   Cardiovascular: Negative for chest pain.  Gastrointestinal: Positive for Hamilton. Negative for abdominal pain, blood in stool, nausea and vomiting.  Musculoskeletal: Negative for back pain.  Skin: Negative for rash.  Neurological: Negative for dizziness and headaches.    Vital Signs: BP (!) 151/79   Pulse 98   Temp 99.3 F (37.4 C) (Oral)   Resp 16   Ht 6' (1.829 m)   Wt 222 lb (100.7 kg)   SpO2 100%   BMI 30.11 kg/m   Physical Exam Vitals signs reviewed.  Constitutional:      General: He is not in acute distress. HENT:     Head: Normocephalic.     Mouth/Throat:     Mouth: Mucous membranes are moist.     Pharynx: Oropharynx is clear. No oropharyngeal exudate or posterior oropharyngeal erythema.  Cardiovascular:     Rate and Rhythm: Normal rate and regular rhythm.  Pulmonary:     Effort: Pulmonary effort is normal.     Breath sounds: Normal breath sounds.  Abdominal:     General: Bowel sounds are normal. There is no distension.     Palpations: Abdomen is soft.     Tenderness: There is no abdominal tenderness.  Skin:    General: Skin is warm and dry.  Neurological:     Mental Status: He is alert and oriented to person, place, and time.  Psychiatric:        Mood and Affect: Mood normal.        Behavior: Behavior normal.        Thought Content: Thought content normal.        Judgment: Judgment normal.      MD Evaluation Airway: WNL Heart: WNL Abdomen: WNL Chest/ Lungs: WNL ASA  Classification: 2 Mallampati/Airway Score: One   Imaging: No results found.  Labs:  CBC: Recent Labs    06/06/19 1009  WBC 6.6  HGB 16.0  HCT 47.1  PLT 252    COAGS: Recent Labs    06/06/19 1009  INR 1.0    BMP: No results for input(s): NA, K, CL, CO2, GLUCOSE, BUN, CALCIUM, CREATININE, GFRNONAA, GFRAA in the last  8760 hours.  Invalid input(s): CMP  LIVER FUNCTION TESTS: No results for input(s): BILITOT, AST, ALT, ALKPHOS, PROT, ALBUMIN in the last 8760 hours.  TUMOR MARKERS: No results for input(s): AFPTM, CEA, CA199, CHROMGRNA in the last 8760 hours.  Assessment and Plan:  48 y/o M with elevated  LFTs and possible fatty liver on [place] who presents today for a random liver biopsy to further evaluate.   Patient has been NPO since 9 pm last night, he does not take blood thinning medications. Afebrile, WBC 6.6, hgb 16.0, plt 252, INR 1.0.  Risks and benefits of random liver biopsy was discussed with the patient and/or patient's family including, but not limited to bleeding, infection, damage to adjacent structures or low yield requiring additional tests.  All of the questions were answered and there is agreement to proceed.  Consent signed and in chart.  Thank you for this interesting consult.  I greatly enjoyed meeting Kristopher Hamilton and look forward to participating in their care.  A copy of this report was sent to the requesting provider on this date.  Electronically Signed: Joaquim Nam, PA-C 06/06/2019, 10:39 AM   I spent a total of 15 Minutes in face to face in clinical consultation, greater than 50% of which was counseling/coordinating care for random liver biopsy.

## 2019-06-06 NOTE — Discharge Instructions (Addendum)
Liver Biopsy ° °The liver is a large organ in the upper right side of the abdomen. A liver biopsy is a procedure in which a tissue sample is taken from the liver and examined under a microscope. °There are three types of liver biopsies: °· Percutaneous. A needle is used to remove a sample through an incision in your abdomen. °· Laparoscopic. Several incisions are made in the abdomen. A sample is removed with the help of a tiny camera. °· Transjugular. An incision is made in your neck in the area of the jugular vein. A sample is removed through a small flexible tube that is passed down the blood vessel and into your liver. °Tell a health care provider about: °· Any allergies you have. °· All medicines you are taking, including vitamins, herbs, eye drops, creams, and over-the-counter medicines. °· Any problems you or family members have had with anesthetic medicines. °· Any blood disorders you have. °· Any surgeries you have had. °· Any medical conditions you have. °· Whether you are pregnant or may be pregnant. °What are the risks? °Generally, this is a safe procedure. However, problems can occur and include: °· Bleeding. °· Infection. °· Bruising. °· Pain. °· Injury to nearby organs or tissues, such as nerves, gallbladder, liver, or lungs. °What happens before the procedure? °Eating and drinking restrictions °· You may be asked not to drink or eat for 6-8 hours before the liver biopsy. You may be allowed to eat a light breakfast. Talk to your health care provider about when you should stop eating and drinking. °Medicines °Ask your health care provider about: °· Changing or stopping your regular medicines. This is especially important if you are taking diabetes medicines or blood thinners. °· Taking medicines such as aspirin and ibuprofen. These medicines can thin your blood. Do not take these medicines unless your health care provider tells you to take them. °· Taking over-the-counter medicines, vitamins, herbs, and  supplements. °General instructions °· Do not use any products that contain nicotine or tobacco, such as cigarettes and e-cigarettes. If you need help quitting, ask your health care provider. °· Plan to have someone take you home from the hospital or clinic. °· Plan to have a responsible adult care for you for at least 24 hours after you leave the hospital or clinic. This is important. °· You may have blood or urine tests. °· Ask your health care provider what steps will be taken to prevent infection. These may include: °? Removing hair at the surgery site. °? Washing skin with a germ-killing soap. °? Taking antibiotic medicine. °What happens during the procedure? °· An IV will be inserted into one of your veins. °? You will be given one or more of the following: °? A medicine to help you relax (sedative). °? A medicine to numb the area (local anesthetic). °? A medicine to make you fall asleep (general anesthetic). °· Your health care provider will use one of the following procedures to remove samples from your liver. These procedures may vary among health care providers and hospitals. °Percutaneous liver biopsy °· You will lie on your back, with your right hand over your head. °· A health care provider will locate your liver by tapping and pressing on the right side of your abdomen, or by using an ultrasound or CT scan. °· A local anesthetic will be used to numb an area at the bottom of your last right rib. °· A small incision will be made in the numbed area. °·   A biopsy needle will be inserted into the incision. °· Several samples of liver tissue will be taken. You will be asked to hold your breath as each sample is taken. °· The incision will be closed with stitches (sutures). °· A bandage (dressing) may be placed over the incision. °Laparoscopic liver biopsy °· You will lie on your back. °· Several small incisions will be made in your abdomen. °· Your health care provider will pass a tiny camera through one  incision. The camera will allow the liver to be viewed on a TV monitor in the operating room. °· Tools will be passed through the other incision or incisions. °· Samples of the liver will be removed using the tools. °· The incisions will be closed with stitches (sutures). °· A bandage (dressing) may be placed over the incisions. °Transjugular liver biopsy °· You will lie on your back on an X-ray table, with your head turned to your left. °· An area on your neck, just over your jugular vein, will be numbed. °· An incision will be made in the numbed area. °· A tiny tube will be inserted through the incision. The tube will be passed into the jugular vein to a blood vessel in the liver called the hepatic vein. °· A dye will be injected through the tube. °· X-rays will be taken. The dye will make the blood vessels in the liver light up on the X-rays. °· The biopsy needle will be placed through the tube until it reaches the liver. °· Samples of liver tissue will be taken with the biopsy needle. °· The needle and the tube will be removed. °· The incision will be closed with stitches (sutures). °· A bandage (dressing) may be placed over the incision. °What happens after the procedure? °· Your blood pressure, heart rate, breathing rate, and blood oxygen level will be monitored until you leave the hospital or clinic. °· You will be asked to rest quietly for 2-4 hours or longer. °· You will be closely monitored for bleeding from the biopsy site. °· You may be allowed to go home when the medicines have worn off and you can walk, drink, eat, and use the bathroom. °Summary °· A liver biopsy is a procedure in which a tissue sample is taken from the liver and examined under a microscope. °· This is a safe procedure, but problems can occur, including bleeding, infection, pain, or injury to nearby organs or tissues. °· Ask your health care provider about changing or stopping your regular medicines. °· Plan to have someone take you  home from the hospital or clinic and to be with you for 24 hours after the procedure. °This information is not intended to replace advice given to you by your health care provider. Make sure you discuss any questions you have with your health care provider. °Document Released: 10/28/2003 Document Revised: 08/17/2017 Document Reviewed: 08/17/2017 °Elsevier Patient Education © 2020 Else[employer] Inc[employer] °Moderate Conscious Sedation, Adult, Care After °These instructions provide you with information about caring for yourself after your procedure. Your health care provider may also give you more specific instructions. Your treatment has been planned according to current medical practices, but problems sometimes occur. Call your health care provider if you have any problems or questions after your procedure. °What can I expect after the procedure? °After your procedure, it is common: °· To feel sleepy for several hours. °· To feel clumsy and have poor balance for several hours. °· To have poor judgment for   several hours. °· To vomit if you eat too soon. °Follow these instructions at home: °For at least 24 hours after the procedure: ° °· Do not: °? Participate in activities where you could fall or become injured. °? Drive. °? Use heavy machinery. °? Drink alcohol. °? Take sleeping pills or medicines that cause drowsiness. °? Make important decisions or sign legal documents. °? Take care of children on your own. °· Rest. °Eating and drinking °· Follow the diet recommended by your health care provider. °· If you vomit: °? Drink water, juice, or soup when you can drink without vomiting. °? Make sure you have little or no nausea before eating solid foods. °General instructions °· Have a responsible adult stay with you until you are awake and alert. °· Take over-the-counter and prescription medicines only as told by your health care provider. °· If you smoke, do not smoke without supervision. °· Keep all follow-up visits as told by  your health care provider. This is important. °Contact a health care provider if: °· You keep feeling nauseous or you keep vomiting. °· You feel light-headed. °· You develop a rash. °· You have a fever. °Get help right away if: °· You have trouble breathing. °This information is not intended to replace advice given to you by your health care provider. Make sure you discuss any questions you have with your health care provider. °Document Released: 05/28/2013 Document Revised: 07/20/2017 Document Reviewed: 11/27/2015 °Elsevier Patient Education © 2020 Elsevier Inc. ° °

## 2019-06-06 NOTE — Procedures (Signed)
Interventional Radiology Procedure Note  Procedure: US guided liver biopsy, medical liver  Complications: None Recommendations:  - 2 hr recovery - dc home 2 hours - advance diet - Do not submerge - Routine care   Signed,  Dulcy Fanny. Earleen Newport, DO

## 2019-06-09 LAB — SURGICAL PATHOLOGY

## 2019-06-16 NOTE — Progress Notes (Signed)
Mr. Mccarter,  Fatty liver changes here - sorry for the delay but the pathology itself was not routed to me.  Thanks for reminding me.  I will call you later this week and explain more.  Best regards,  [name], MD, [facility]

## 2019-06-20 ENCOUNTER — Encounter: Payer: Self-pay | Admitting: Internal Medicine

## 2019-06-20 NOTE — Progress Notes (Signed)
Weight loss is only proven treatment  Please check out the following:  Healthy and nutritious eating and weight loss are made to be harder than they need to be. A simplified diet approach based around eating normally, as much as you want without restricting intake too much is better, I think. You must avoid packaged foods and try to eat real foods as much as possible. Packaged foods, sugary sodas, highly processed foods taste great but are slow poisons that lead to obesity and/or poor health.  Some resources that I like are:  www.gaplesinstitute.org (Do the learning modules about healthy eating)  www.dietdoctor.com - helps with low carb diets and also can learn about and consider intermittent  fasting. If you have diabetes would not do intermittent fasting without checking with your doctor. Best to change what you eat before doing this. Check out the diabetes section.     I recommend you check your blood sugar daily and keep a log. If you are diabetic and check sugars.  Here are some guidelines to help you with meal planning -  Avoid all processed and packaged foods (bread, pasta, crackers, chips, etc) and beverages containing calories.  Avoid added sugars and excessive natural sugars.  Attention to how you feel if you consume artificial sweeteners.  Do they make you more hungry or raise your blood sugar?  With every meal and snack, aim to get 20 g of protein (3 ounces of meat, 4 ounces of fish, 3 eggs, protein powder, 1 cup [place] yogurt, 1 cup cottage cheese, etc.)  Increase fiber in the form of non-starchy vegetables.  These help you feel full with very little carbohydrates and are good for gut health.  Eat 1 serving healthy carb per meal- 1/2 cup brown rice, beans, potato, corn- pay attention to whether or not this significantly raises your blood sugar if you are checking blood sugars. If it does, reduce the frequency you consume these.   Eat 2-3 servings of lower sugar fruits daily.   This includes berries, apples, oranges, peaches, pears, one half banana.  Have small amounts of good fats such as avocado, nuts, olive oil, nut butters, olives.  Add a little cheese to your salads to make them tasty.     Www.gaplesinstitute.org - do the

## 2019-07-15 ENCOUNTER — Ambulatory Visit: Payer: BC Managed Care – PPO | Admitting: Internal Medicine

## 2019-07-15 ENCOUNTER — Other Ambulatory Visit (INDEPENDENT_AMBULATORY_CARE_PROVIDER_SITE_OTHER): Payer: BC Managed Care – PPO

## 2019-07-15 ENCOUNTER — Encounter: Payer: Self-pay | Admitting: Internal Medicine

## 2019-07-15 VITALS — BP 110/70 | HR 93 | Temp 98.4°F | Ht 72.0 in | Wt 223.0 lb

## 2019-07-15 DIAGNOSIS — E782 Mixed hyperlipidemia: Secondary | ICD-10-CM | POA: Diagnosis not present

## 2019-07-15 DIAGNOSIS — K7581 Nonalcoholic steatohepatitis (NASH): Secondary | ICD-10-CM

## 2019-07-15 DIAGNOSIS — E669 Obesity, unspecified: Secondary | ICD-10-CM

## 2019-07-15 DIAGNOSIS — E8881 Metabolic syndrome: Secondary | ICD-10-CM | POA: Diagnosis not present

## 2019-07-15 LAB — HEPATIC FUNCTION PANEL
ALT: 315 U/L — ABNORMAL HIGH (ref 0–53)
AST: 131 U/L — ABNORMAL HIGH (ref 0–37)
Albumin: 4.1 g/dL (ref 3.5–5.2)
Alkaline Phosphatase: 110 U/L (ref 39–117)
Bilirubin, Direct: 0.1 mg/dL (ref 0.0–0.3)
Total Bilirubin: 0.3 mg/dL (ref 0.2–1.2)
Total Protein: 7.2 g/dL (ref 6.0–8.3)

## 2019-07-15 NOTE — Progress Notes (Signed)
Kristopher Hamilton 48 y.o. 1971/05/10 [id number]  Assessment & Plan:  NASH (nonalcoholic steatohepatitis) He has significant steatohepatitis and mild fibrosis.  An aggressive form of disease.  For steps will be weight loss management.  Advice on low-carb diet and healthy eating through the [facility] provided and I have asked the patient to look at this and to try to modify diet.  Explained how there are no pharmacologic treatments, some people use vitamin E but there is an increased risk of coronary artery disease with that and it is not thought to be as effective in patients with diabetes.  Regarding his diabetes aggressive blood sugar control is important.  Restart statin.  Consider pioglitazone or liraglutide for additional treatment of diabetes.  These may have helpful effects on the steatohepatitis.  He essentially does not drink alcohol so that is not an issue.  Return to clinic in about 3 months  He is not immune to hepatitis B and I am not certain about his hepatitis A status.  He has had a flu vaccine this year.  I will check his hepatitis A status.  We will immunize hepatitis A and B as well.  He could do that through primary care or we can send a prescription to a pharmacy.  It would be difficult for him to travel to our office for this.  Obesity, Class I, BMI 30-34.9 Weight loss advice given.  He will work on this.  See Karlene Lineman assessment and plan also.  Metabolic syndrome As per Karlene Lineman assessment.  He has increased coronary artery disease risk and other complications.  Aggressive lipid control blood glucose control and weight loss.  Mixed hyperlipidemia Restart statin.  Though there always concerns about elevated LFTs, statins are is safe in [place] and almost everyone.  His LFTs remain elevated despite being off his statin for a year.   Orders Placed This Encounter  Procedures  . Hepatitis A Ab, Total  . Hepatic function panel    Colonoscopy 2021 for screening  Lab Results   Component Value Date   ALT 315 (H) 07/15/2019   AST 131 (H) 07/15/2019   ALKPHOS 110 07/15/2019   BILITOT 0.3 07/15/2019    Meds ordered this encounter  Medications  . rosuvastatin (CRESTOR) 5 MG tablet    Sig: Take 1 tablet (5 mg total) by mouth daily at 6 PM.    Dispense:  90 tablet    Refill:  3    I appreciate the opportunity to care for this patient. CC: Tempie Hoist, FNP   Subjective:   Chief Complaint: Follow-up of nonalcoholic fatty liver disease  HPI Kristopher Hamilton is here with his wife to review his liver biopsy.  He has had longstanding elevation of transaminases 5-6 times.  Serologic work-up for infection, autoimmune inherited and acquired diseases has been negative.  A liver biopsy in October demonstrated moderately active steatohepatitis grade 2 of 3 and score of 4 of 8 for NAFLD.  Mild fibrosis stage II of 4.  No bridging yet.  The patient rarely drinks alcohol may be a couple of times of year 1 drink.  He has diabetes.  He has been off his statin for a year. No Known Allergies Current Meds  Medication Sig  . aspirin EC 81 MG tablet Take 81 mg by mouth daily.  Marland Kitchen docusate sodium (COLACE) 100 MG capsule Take 100 mg by mouth at bedtime.   . fexofenadine (ALLEGRA) 180 MG tablet Take 180 mg by mouth daily.  Marland Kitchen  lisinopril (PRINIVIL,ZESTRIL) 5 MG tablet Take 5 mg by mouth daily.  . Magnesium Oxide 250 MG TABS Take 400 mg by mouth at bedtime.   . metFORMIN (GLUCOPHAGE) 500 MG tablet Take 1,000 mg by mouth 2 (two) times daily with a meal.   . pantoprazole (PROTONIX) 40 MG tablet Take 40 mg by mouth at bedtime.    Past Medical History:  Diagnosis Date  . Diabetes ([place])   . GERD (gastroesophageal reflux disease)   . NAFLD (nonalcoholic fatty liver disease)   . Skin cancer, basal cell   . Sleep apnea    Past Surgical History:  Procedure Laterality Date  . LUMBAR SPINE SURGERY    . SKIN CANCER EXCISION     Social History   Social History Narrative   He is married and  works at [employer] in [place]   3 children   [place] rare alcohol   Never smoker, former snuff user, no drug use reported and no supplements   family history includes Diabetes in his father; Hypertension in his mother; Prostate cancer in his father and maternal grandfather.   Review of Systems As per HPI  Objective:   Physical Exam BP 110/70   Pulse 93   Temp 98.4 F (36.9 C)   Ht 6' (1.829 m)   Wt 223 lb (101.2 kg)   BMI 30.24 kg/m   25 minutes time spent with patient > half in counseling coordination of care

## 2019-07-15 NOTE — Patient Instructions (Addendum)
    NUTRITION ADVICE  Healthy and nutritious eating and weight loss are made to be harder than they need to be. A simplified diet approach based around eating normally, as much as you want without restricting intake too much is better, I think. You must avoid packaged foods and try to eat real foods as much as possible. Packaged foods, sugary sodas, highly processed foods taste great but are slow poisons that lead to obesity and/or poor health.  It is very helpful to take some time each week and plan your meals. Work with your spouse, partner, family to do this as much as possible. Preparing meals ahead to take to work or school is especially helpful and will save money, too. You can do this and by working together it can take less time.  Some resources that I like are:  www.[facility].org (Do the learning modules about healthy eating)  www.dietdoctor.com - helps with low carb diets and also can learn about and consider intermittent  fasting. If you have diabetes would not do intermittent fasting without checking with your doctor. Best to change what you eat before doing this.  Www.drberry.com   I recommend you check your blood sugar daily and keep a log. If you are diabetic and check sugars.  Here are some guidelines to help you with meal planning -  Avoid all processed and packaged foods (bread, pasta, crackers, chips, etc) and beverages containing calories.  Avoid added sugars and excessive natural sugars.  Attention to how you feel if you consume artificial sweeteners.  Do they make you more hungry or raise your blood sugar?  With every meal and snack, aim to get 20 g of protein (3 ounces of meat, 4 ounces of fish, 3 eggs, protein powder, 1 cup [place] yogurt, 1 cup cottage cheese, etc.)  Increase fiber in the form of non-starchy vegetables.  These help you feel full with very little carbohydrates and are good for gut health.  Eat 1 serving healthy carb per meal- 1/2 cup brown rice,  beans, potato, corn- pay attention to whether or not this significantly raises your blood sugar if you are checking blood sugars. If it does, reduce the frequency you consume these.   Eat 2-3 servings of lower sugar fruits daily.  This includes berries, apples, oranges, peaches, pears, one half banana.  Have small amounts of good fats such as avocado, nuts, olive oil, nut butters, olives.  Add a little cheese to your salads to make them tasty.   Your provider has requested that you go to the basement level for lab work before leaving today. Press "B" on the elevator. The lab is located at the first door on the left as you exit the elevator.  We are giving you a handout to read on fatty liver.  Please call us mid December for a late January/early February appointment.  I appreciate the opportunity to care for you. Silvano Rusk, MD, [facility]

## 2019-07-16 LAB — HEPATITIS A ANTIBODY, TOTAL: Hepatitis A AB,Total: NONREACTIVE

## 2019-07-17 ENCOUNTER — Encounter: Payer: Self-pay | Admitting: Internal Medicine

## 2019-07-17 DIAGNOSIS — E8881 Metabolic syndrome: Secondary | ICD-10-CM | POA: Insufficient documentation

## 2019-07-17 DIAGNOSIS — E669 Obesity, unspecified: Secondary | ICD-10-CM | POA: Insufficient documentation

## 2019-07-17 DIAGNOSIS — E66811 Obesity, class 1: Secondary | ICD-10-CM | POA: Insufficient documentation

## 2019-07-17 DIAGNOSIS — K7581 Nonalcoholic steatohepatitis (NASH): Secondary | ICD-10-CM | POA: Insufficient documentation

## 2019-07-17 DIAGNOSIS — E782 Mixed hyperlipidemia: Secondary | ICD-10-CM | POA: Insufficient documentation

## 2019-07-17 MED ORDER — ROSUVASTATIN CALCIUM 5 MG PO TABS: 5.0000 mg | ORAL_TABLET | Freq: Every day | ORAL | 3 refills | Status: AC

## 2019-07-17 NOTE — Assessment & Plan Note (Addendum)
He has significant steatohepatitis and mild fibrosis.  An aggressive form of disease.  For steps will be weight loss management.  Advice on low-carb diet and healthy eating through the [facility] provided and I have asked the patient to look at this and to try to modify diet.  Explained how there are no pharmacologic treatments, some people use vitamin E but there is an increased risk of coronary artery disease with that and it is not thought to be as effective in patients with diabetes.  Regarding his diabetes aggressive blood sugar control is important.  Restart statin.  Consider pioglitazone or liraglutide for additional treatment of diabetes.  These may have helpful effects on the steatohepatitis.  He essentially does not drink alcohol so that is not an issue.  Return to clinic in about 3 months  He is not immune to hepatitis B and I am not certain about his hepatitis A status.  He has had a flu vaccine this year.  I will check his hepatitis A status.  We will immunize hepatitis A and B as well.  He could do that through primary care or we can send a prescription to a pharmacy.  It would be difficult for him to travel to our office for this.

## 2019-07-17 NOTE — Assessment & Plan Note (Signed)
Restart statin.  Though there always concerns about elevated LFTs, statins are is safe in [place] and almost everyone.  His LFTs remain elevated despite being off his statin for a year.

## 2019-07-17 NOTE — Assessment & Plan Note (Signed)
As per Karlene Lineman assessment.  He has increased coronary artery disease risk and other complications.  Aggressive lipid control blood glucose control and weight loss.

## 2019-07-17 NOTE — Progress Notes (Signed)
Kristopher Hamilton,  Your liver chemistries remain elevated which is not a surprise.  We will use this as a new baseline and to see what happens after you lose weight and we see each other again in about 3 months.  Once you get an appointment for February, let me know when that is and we can send some order so you can get liver chemistries and I think we ought to follow-up on lipids and hemoglobin A1c as well. I have restarted your generic Crestor.  Prescription sent to the pharmacy.  You are not immune to hepatitis A or B and should have a vaccination.  I think Ms. [name] could do that or you could go to a pharmacy.  If you want to go to a pharmacy I can send a prescription.  Just let me know.  Happy Thanksgiving and I appreciate the opportunity to care for you.  Let me know if any questions.  [name], MD, [name]

## 2019-07-17 NOTE — Assessment & Plan Note (Signed)
Weight loss advice given.  He will work on this.  See Karlene Lineman assessment and plan also.

## 2020-04-21 IMAGING — US US BIOPSY CORE LIVER
1 series · 12 of 12 positions shown · non-contrast
Comparison: none

INDICATION: 48-year-old male with a history of LFT elevation

[Series 1: us biopsy core liver · 12 of 12 slices shown]
[im 1/12]
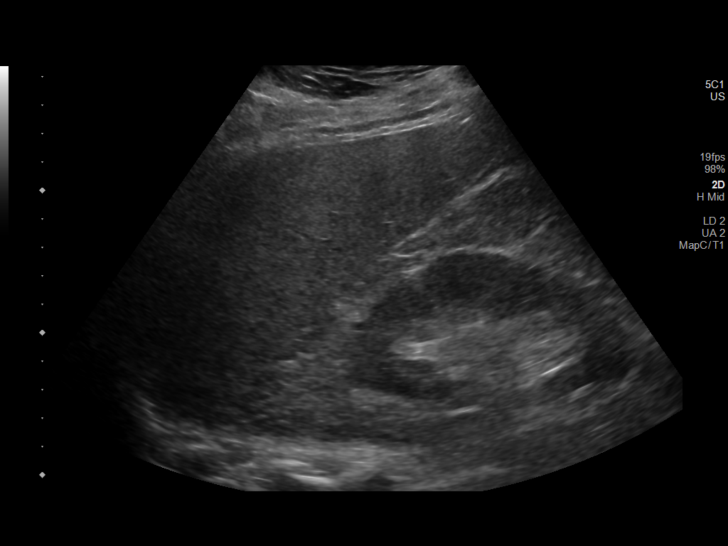
[im 2/12]
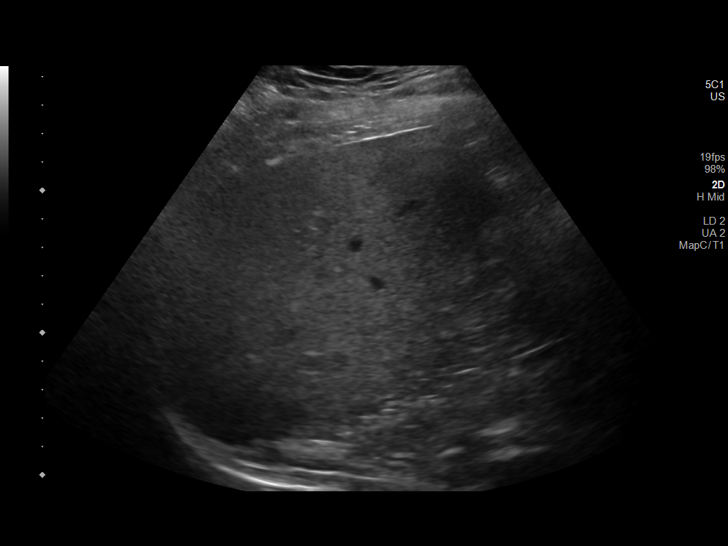
[im 3/12]
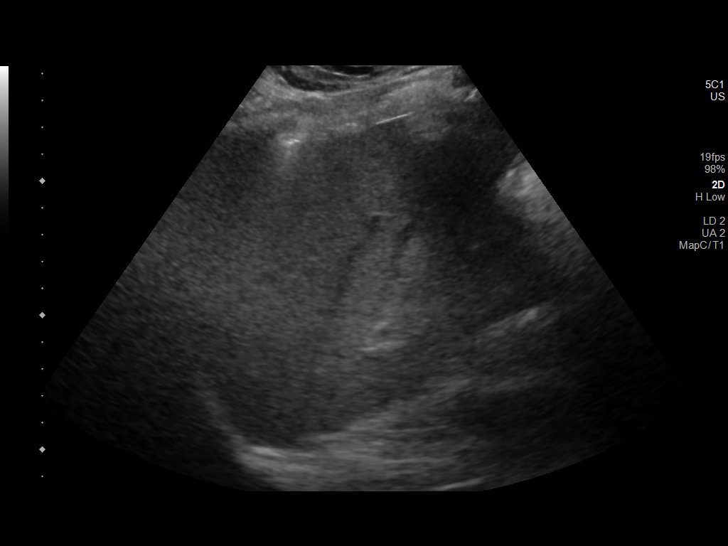
[im 4/12]
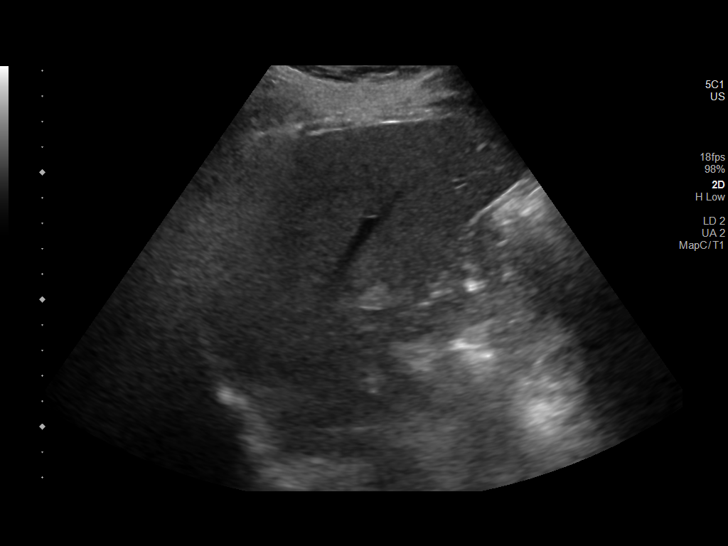
[im 5/12]
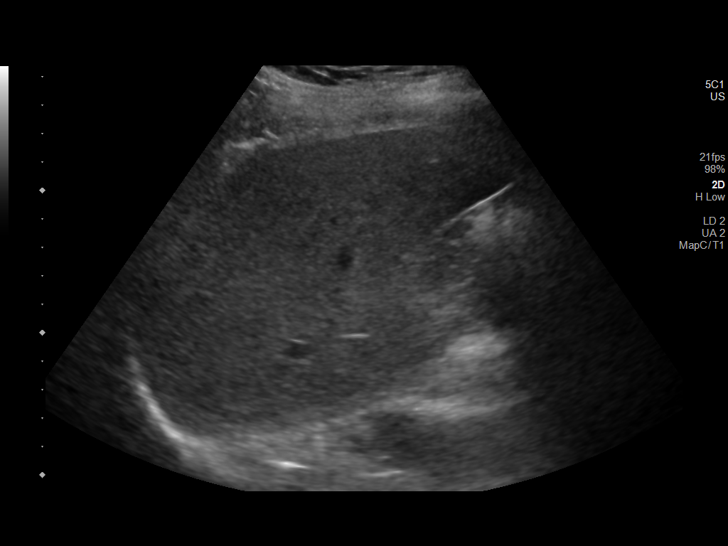
[im 6/12]
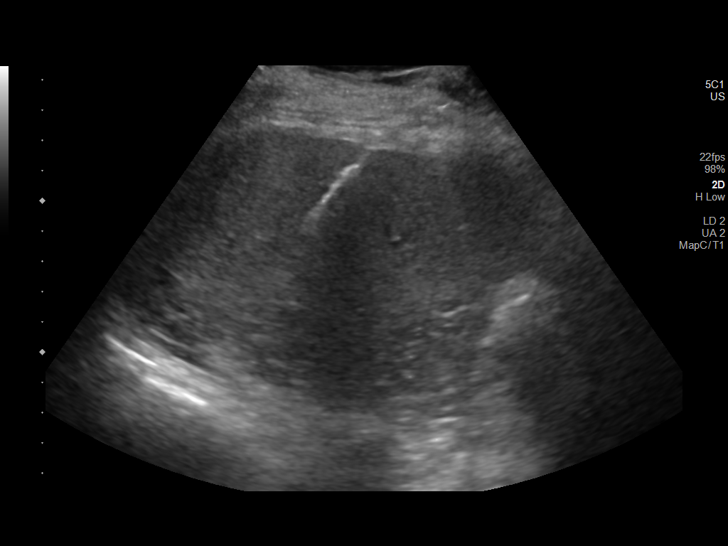
[im 7/12]
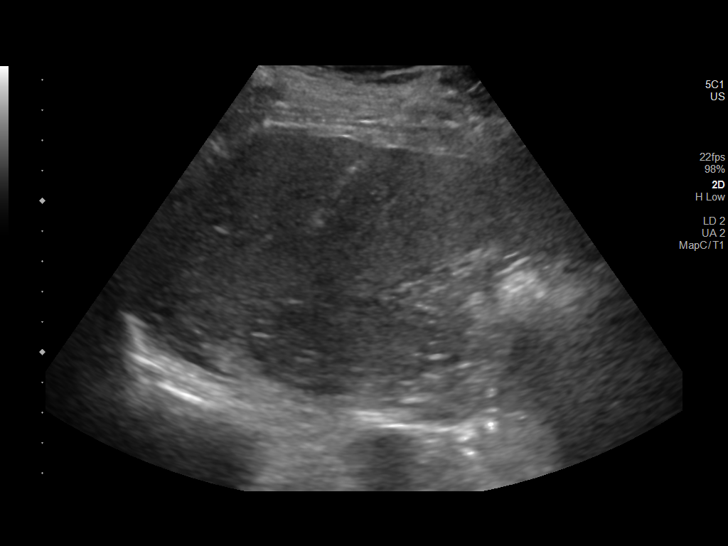
[im 8/12]
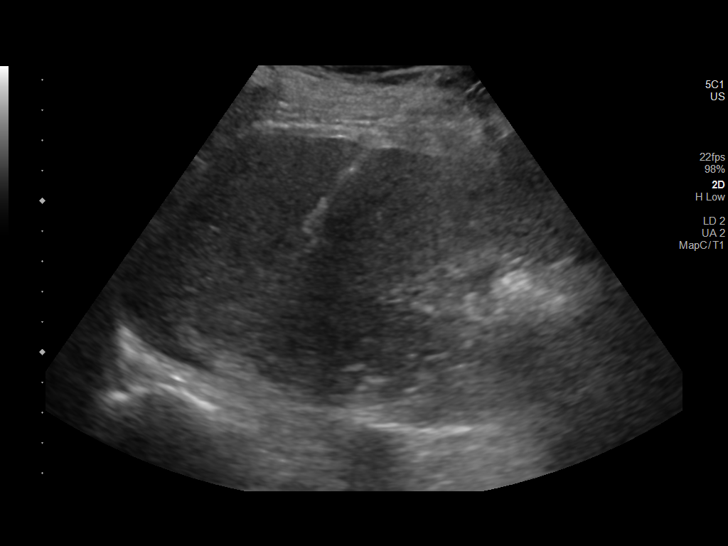
[im 9/12]
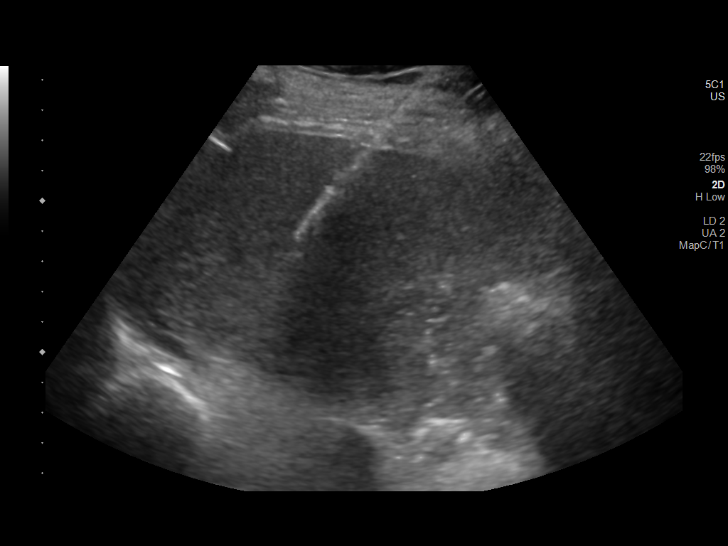
[im 10/12]
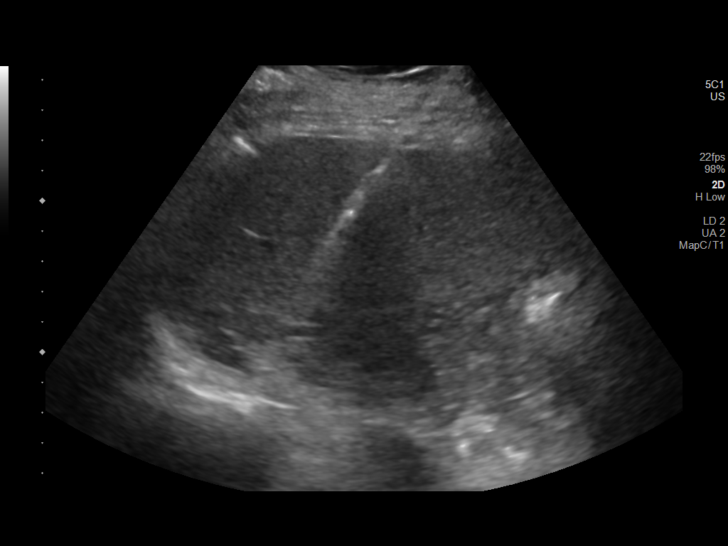
[im 11/12]
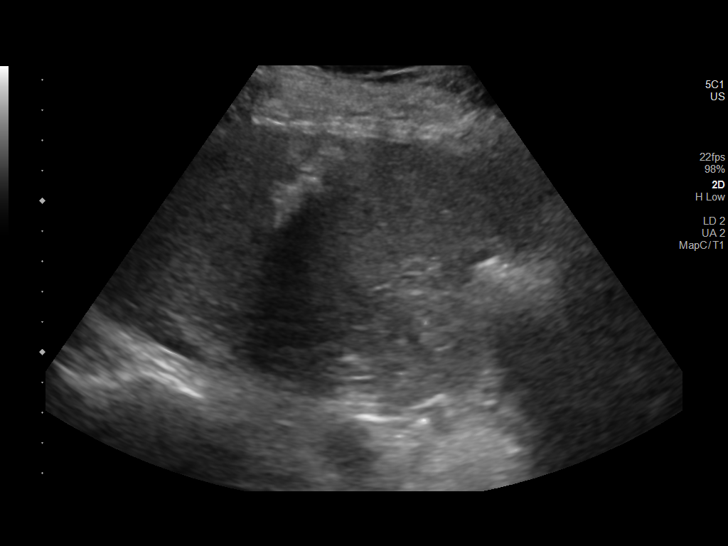
[im 12/12]
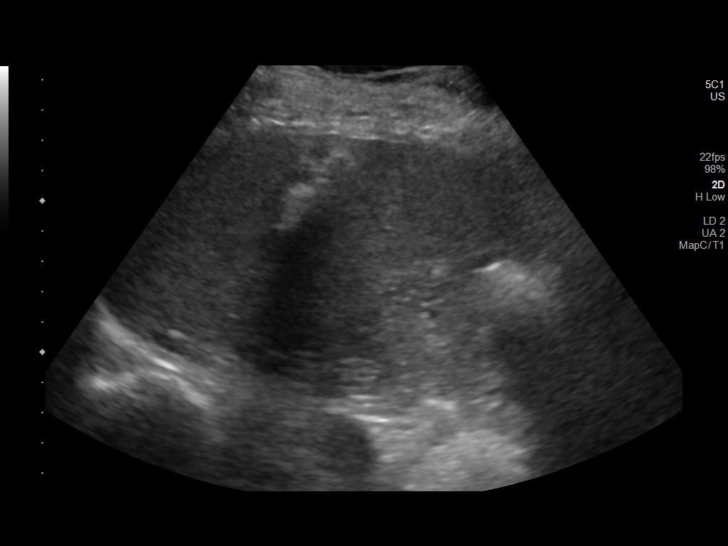

[12 of 12 positions shown; findings below may reference images not displayed]

EXAM:
IMAGE GUIDED MEDICAL LIVER BIOPSY

MEDICATIONS:
None.

ANESTHESIA/SEDATION:
Moderate (conscious) sedation was employed during this procedure. A
total of Versed 1.0 mg and Fentanyl 50 mcg was administered
intravenously.

Moderate Sedation Time: 10 minutes. The patient's level of
consciousness and vital signs were monitored continuously by
radiology nursing throughout the procedure under my direct
supervision.

FLUOROSCOPY TIME:  ULTRASOUND

COMPLICATIONS:
NONE

PROCEDURE:
The procedure, risks, benefits, and alternatives were explained to
the patient. Questions regarding the procedure were encouraged and
answered. The patient understands and consents to the procedure.

Ultrasound survey of the right liver lobe performed with images
stored and sent to PACs.

The right lower thorax/right upper abdomen was prepped with
chlorhexidine in a sterile fashion, and a sterile drape was applied
covering the operative field. A sterile gown and sterile gloves were
used for the procedure. Local anesthesia was provided with 1%
Lidocaine.

Once the patient is prepped and draped sterilely and the skin and
subcutaneous tissues were generously infiltrated with 1% lidocaine,
a small stab incision was made with an 11 blade scalpel.

A 17 gauge introducer needle was advanced under ultrasound guidance
in an intercostal location into the right liver lobe. The stylet was
removed, and 3 separate 18 gauge core biopsy were retrieved. Samples
were placed into formalin for transportation to the lab.

Three separate Gel-Foam pledgets were then infused with a small
amount of saline for assistance with hemostasis.

The needle was removed, and a final ultrasound image was performed.

The patient tolerated the procedure well and remained
hemodynamically stable throughout.

No complications were encountered and no significant blood loss was
encounter.
IMPRESSION: Status post ultrasound-guided medical liver biopsy. Tissue specimen
sent to pathology for complete histopathologic analysis.

## 2020-10-28 ENCOUNTER — Other Ambulatory Visit: Payer: Self-pay

## 2020-10-28 ENCOUNTER — Encounter: Payer: Self-pay | Admitting: Podiatry

## 2020-10-28 ENCOUNTER — Ambulatory Visit: Payer: BC Managed Care – PPO | Admitting: Podiatry

## 2020-10-28 DIAGNOSIS — M779 Enthesopathy, unspecified: Secondary | ICD-10-CM

## 2020-10-28 NOTE — Progress Notes (Signed)
Subjective:   Patient ID: Kristopher Hamilton, male   DOB: 50 y.o.   MRN: [id number]   HPI Patient presents stating the orthotics been very helpful but they are breaking down and I need a new pair   ROS      Objective:  Physical Exam  Neurovascular status intact moderate depression of the arch with patient having history of fascial symptomatology which is present with improvement upon deep palpation and occasional discomfort     Assessment:  Improvement of fascial-like symptomatology     Plan:  H&P reviewed condition recommended orthotics for the long-term needs patient will have the same type made and can have his existing pair rehabilitated.  Reappoint when ready and discussed continued stretching exercises and shoe gear modifications

## 2020-11-15 DIAGNOSIS — M779 Enthesopathy, unspecified: Secondary | ICD-10-CM
